# Patient Record
Sex: Male | Born: 1975 | Race: White | Hispanic: No | Marital: Married | Smoking: Current every day smoker
Health system: Southern US, Community
[De-identification: ages and names within clinical notes are randomized; demographics above are authoritative.]

## PROBLEM LIST (undated history)

## (undated) DIAGNOSIS — I10 Essential (primary) hypertension: Secondary | ICD-10-CM

---

## 2016-05-23 ENCOUNTER — Emergency Department
Admission: EM | Admit: 2016-05-23 | Discharge: 2016-05-23 | Disposition: A | Discharge status: Home / Self Care | Payer: Self-pay | Source: Home / Self Care | Attending: Family Medicine | Admitting: Family Medicine

## 2016-05-23 ENCOUNTER — Encounter: Payer: Self-pay | Admitting: *Deleted

## 2016-05-23 ENCOUNTER — Emergency Department (INDEPENDENT_AMBULATORY_CARE_PROVIDER_SITE_OTHER): Payer: Self-pay

## 2016-05-23 DIAGNOSIS — M5442 Lumbago with sciatica, left side: Secondary | ICD-10-CM

## 2016-05-23 DIAGNOSIS — M4807 Spinal stenosis, lumbosacral region: Secondary | ICD-10-CM

## 2016-05-23 DIAGNOSIS — M48061 Spinal stenosis, lumbar region without neurogenic claudication: Secondary | ICD-10-CM

## 2016-05-23 DIAGNOSIS — G8929 Other chronic pain: Secondary | ICD-10-CM

## 2016-05-23 MED ORDER — HYDROCODONE-ACETAMINOPHEN 5-325 MG PO TABS: ORAL_TABLET | ORAL | 0 refills | Status: AC

## 2016-05-23 MED ORDER — PREDNISONE 20 MG PO TABS: ORAL_TABLET | ORAL | 0 refills | Status: AC

## 2016-05-23 NOTE — ED Provider Notes (Signed)
[facility]    CSN: [id number] Arrival date & time: 05/23/16  1738     History   Chief Complaint Chief Complaint  Patient presents with  . Back Pain    HPI Glenn Aguirre is a 41 y.o. male.   Patient complains of 1.5 month history of left lower back/buttock pain that subsequently began to radiate into his left lower leg to the lateral calf.  The pain is worse when walking and somewhat better sitting.   He denies bowel or bladder dysfunction, and no saddle numbness.  He recalls no injury.  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  He has had poor response to ice packs and stretching exercises.   The history is provided by the patient.  Back Pain  Location:  Lumbar spine and gluteal region Quality:  Aching Radiates to: left lower lateral leg. Pain severity:  Mild Pain is:  Worse during the day Onset quality:  Sudden Duration:  6 weeks Timing:  Constant Progression:  Worsening Chronicity:  New Context: lifting heavy objects and recent injury   Relieved by:  Nothing Worsened by:  Ambulation Ineffective treatments:  Cold packs Associated symptoms: no abdominal pain, no bladder incontinence, no bowel incontinence, no dysuria, no fever, no headaches, no leg pain, no numbness, no paresthesias, no pelvic pain, no perianal numbness, no tingling, no weakness and no weight loss     History reviewed. No pertinent past medical history.  There are no active problems to display for this patient.   History reviewed. No pertinent surgical history.     Home Medications    Prior to Admission medications   Medication Sig Start Date End Date Taking? Authorizing Provider  HYDROcodone-acetaminophen (NORCO/VICODIN) 5-325 MG tablet Take one by mouth at bedtime as needed for pain 05/23/16   Lattie Haw, MD  predniSONE (DELTASONE) 20 MG tablet Take one tab by mouth twice daily for 5 days, then one daily. Take with food. 05/23/16   Lattie Haw, MD    Family History History reviewed. No pertinent family history.  Social History Social History  Substance Use Topics  . Smoking status: Never Smoker  . Smokeless tobacco: Never Used  . Alcohol use No     Allergies   Patient has no known allergies.   Review of Systems Review of Systems  Constitutional: Negative for fever and weight loss.  Gastrointestinal: Negative for abdominal pain and bowel incontinence.  Genitourinary: Negative for bladder incontinence, dysuria and pelvic pain.  Musculoskeletal: Positive for back pain.  Neurological: Negative for tingling, weakness, numbness, headaches and paresthesias.  All other systems reviewed and are negative.    Physical Exam Triage Vital Signs ED Triage Vitals  Enc Vitals Group     BP 05/23/16 1752 147/91     Pulse Rate 05/23/16 1752 92     Resp --      Temp --      Temp src --      SpO2 05/23/16 1752 97 %     Weight 05/23/16 1753 209 lb (94.8 kg)     Height 05/23/16 1753 6' (1.829 m)     Head Circumference --      Peak Flow --      Pain Score 05/23/16 1753 9     Pain Loc --      Pain Edu? --      Excl. in GC? --    No data found.   Updated  Vital Signs BP 147/91 (BP Location: Left Arm)   Pulse 92   Ht 6' (1.829 m)   Wt 209 lb (94.8 kg)   SpO2 97%   BMI 28.35 kg/m   Visual Acuity Right Eye Distance:   Left Eye Distance:   Bilateral Distance:    Right Eye Near:   Left Eye Near:    Bilateral Near:     Physical Exam  Constitutional: He appears well-developed and well-nourished. No distress.  HENT:  Head: Normocephalic.  Right Ear: External ear normal.  Left Ear: External ear normal.  Nose: Nose normal.  Mouth/Throat: Oropharynx is clear and moist.  Eyes: Conjunctivae are normal. Pupils are equal, round, and reactive to light.  Neck: Normal range of motion.  Cardiovascular: Normal heart sounds.   Pulmonary/Chest: Breath sounds normal.  Abdominal: There is no tenderness.    Musculoskeletal: He exhibits no edema.       Lumbar back: He exhibits tenderness. He exhibits normal range of motion.       Back:  Back:  Range of motion relatively well preserved.  Can heel/toe walk and squat without difficulty.  There is tenderness to palpation over the left buttock as noted on diagram.   Straight leg raising test is positive at about 30 degrees from horizontal.  Sitting knee extension test is positive at about 10 degrees from horizontal.  Strength and sensation in the lower extremities is normal.  Patellar and achilles reflexes are normal   Neurological: He is alert.  Skin: Skin is warm and dry. No rash noted.  Nursing note and vitals reviewed.    UC Treatments / Results  Labs (all labs ordered are listed, but only abnormal results are displayed) Labs Reviewed - No data to display  EKG  EKG Interpretation None       Radiology Dg Lumbar Spine Complete  Result Date: 05/23/2016 CLINICAL DATA:  Left lower back pain radiating down left posterior leg for 1-1/2 months. EXAM: LUMBAR SPINE - COMPLETE 4+ VIEW COMPARISON:  None. FINDINGS: There is no evidence of acute lumbar spine fracture nor bone destruction. Alignment is normal. Slight disc space narrowing noted at L4-5 and to a greater extent L5-S1. Mild associated facet joint osteoarthritis L3 through S1, most pronounced on the right at L4-5. Sacroiliac joints are maintained. Small Schmorl's nodes involving the superior endplates of L1 and L2. IMPRESSION: Mild disc space narrowing at L4-5 and L5-S1. Mild lower lumbar facet arthropathy. Electronically Signed   By: Tollie Eth M.D.   On: 05/23/2016 18:36    Procedures Procedures (including critical care time)  Medications Ordered in UC Medications - No data to display   Initial Impression / Assessment and Plan / UC Course  I have reviewed the triage vital signs and the nursing notes.  Pertinent labs & imaging results that were available during my care of the  patient were reviewed by me and considered in my medical decision making (see chart for details).  Clinical Course   Appears to be left L5-S1 radiculopathy. Begin prednisone burst/taper. Lortab for pain at night. Apply ice pack for 20 to 30 minutes, 2 to 3 times daily  Continue until pain decreases.  Followup with Dr. Rodney Langton or Dr. Clementeen Graham ([facility]) if not improving in one week.     Final Clinical Impressions(s) / UC Diagnoses   Final diagnoses:  Chronic left-sided low back pain with left-sided sciatica    New Prescriptions New Prescriptions   HYDROCODONE-ACETAMINOPHEN (NORCO/VICODIN) 5-325 MG  TABLET    Take one by mouth at bedtime as needed for pain   PREDNISONE (DELTASONE) 20 MG TABLET    Take one tab by mouth twice daily for 5 days, then one daily. Take with food.     Lattie HawStephen A Yaminah Clayborn, MD 05/23/16 [phone]

## 2016-05-23 NOTE — Discharge Instructions (Signed)
Apply ice pack for 20 to 30 minutes, 2 to 3 times daily  Continue until pain decreases.

## 2016-05-23 NOTE — ED Triage Notes (Signed)
Patient c/o 1 1/2 months of left lower back/glute pain that started one morning and has gradually become worse. Pain radiates down leg and into left foot. Used ice and stretching exercises @ home.

## 2016-12-26 ENCOUNTER — Emergency Department
Admission: EM | Admit: 2016-12-26 | Discharge: 2016-12-26 | Disposition: A | Discharge status: Home / Self Care | Payer: Self-pay | Source: Home / Self Care | Attending: Family Medicine | Admitting: Family Medicine

## 2016-12-26 ENCOUNTER — Emergency Department (INDEPENDENT_AMBULATORY_CARE_PROVIDER_SITE_OTHER): Payer: Self-pay

## 2016-12-26 DIAGNOSIS — S81031A Puncture wound without foreign body, right knee, initial encounter: Secondary | ICD-10-CM

## 2016-12-26 DIAGNOSIS — M25461 Effusion, right knee: Secondary | ICD-10-CM

## 2016-12-26 DIAGNOSIS — Z23 Encounter for immunization: Secondary | ICD-10-CM

## 2016-12-26 DIAGNOSIS — M7989 Other specified soft tissue disorders: Secondary | ICD-10-CM

## 2016-12-26 DIAGNOSIS — M25561 Pain in right knee: Secondary | ICD-10-CM

## 2016-12-26 HISTORY — DX: Essential (primary) hypertension: I10

## 2016-12-26 MED ORDER — TETANUS-DIPHTH-ACELL PERTUSSIS 5-2.5-18.5 LF-MCG/0.5 IM SUSP
0.5000 mL | Freq: Once | INTRAMUSCULAR | Status: AC
  Administered 2016-12-26: 0.5 mL via INTRAMUSCULAR

## 2016-12-26 MED ORDER — CEPHALEXIN 500 MG PO CAPS: 500.0000 mg | ORAL_CAPSULE | Freq: Three times a day (TID) | ORAL | 0 refills | Status: AC

## 2016-12-26 NOTE — Medical Student Note (Cosign Needed)
[facility] Provider Student Note For educational purposes for Medical, PA and NP students only and not part of the legal medical record.   CSN: [id number] Arrival date & time: 12/26/16  1545     History   Chief Complaint Chief Complaint  Patient presents with  . Knee Pain    right    HPI Glenn Aguirre is a 41 y.o. male.  Pt is a 40 yo male who fell on his screwdriver in the back of his truck. Last tetanus shot is unknown.   The history is provided by the patient.  Knee Pain  Location:  Knee Injury: yes   Knee location:  R knee Pain details:    Quality:  Throbbing   Radiates to:  Does not radiate   Severity:  Mild   Timing:  Constant   Progression:  Waxing and waning Chronicity:  New Dislocation: no   Tetanus status:  Unknown Prior injury to area:  No Relieved by:  NSAIDs and ice Worsened by:  Activity Associated symptoms: decreased ROM, stiffness and swelling   Associated symptoms: no fever     Past Medical History:  Diagnosis Date  . Hypertension     There are no active problems to display for this patient.   History reviewed. No pertinent surgical history.     Home Medications    Prior to Admission medications   Medication Sig Start Date End Date Taking? Authorizing Provider  cephALEXin (KEFLEX) 500 MG capsule Take 1 capsule (500 mg total) by mouth 3 (three) times daily. 12/26/16   Lurene Shadow, PA-C  HYDROcodone-acetaminophen (NORCO/VICODIN) 5-325 MG tablet Take one by mouth at bedtime as needed for pain 05/23/16   Lattie Haw, MD  predniSONE (DELTASONE) 20 MG tablet Take one tab by mouth twice daily for 5 days, then one daily. Take with food. 05/23/16   Lattie Haw, MD    Family History Family History  Problem Relation Age of Onset  . Heart failure Mother   . Heart failure Father     Social History Social History  Substance Use Topics  . Smoking status: Current Every Day Smoker    Packs/day: 1.00  .  Smokeless tobacco: Never Used  . Alcohol use No     Allergies   Patient has no known allergies.   Review of Systems Review of Systems  Constitutional: Negative for fever.  Musculoskeletal: Positive for joint swelling and stiffness.     Physical Exam Updated Vital Signs BP (!) 143/98 (BP Location: Left Arm)   Pulse 99   Temp 98.3 F (36.8 C) (Oral)   Ht 6' (1.829 m)   Wt 97.1 kg   SpO2 99%   BMI 29.02 kg/m   Physical Exam  Constitutional: He appears well-developed and well-nourished.  Musculoskeletal: He exhibits edema and tenderness.  At right knee  Skin: There is erythema.  Mild erythema on the lateral aspect of right knee      ED Treatments / Results    Radiology Dg Knee Complete 4 Views Right  Result Date: 12/26/2016 CLINICAL DATA:  Pain, puncture wound with a screwdriver 10 days ago EXAM: RIGHT KNEE - COMPLETE 4+ VIEW COMPARISON:  None. FINDINGS: No fracture or dislocation is seen. The joint spaces are preserved. Infrapatellar soft tissue swelling. No radiopaque foreign body is seen. IMPRESSION: Infrapatellar soft tissue swelling. No fracture, dislocation, or radiopaque foreign body is seen. Electronically Signed   By: Charline Bills M.D.   On:  12/26/2016 16:31    Procedures Procedures (including critical care time)  Medications Ordered in ED Medications  Tdap (BOOSTRIX) injection 0.5 mL (0.5 mLs Intramuscular Given 12/26/16 1611)     Initial Impression / Assessment and Plan / ED Course  I have reviewed the triage vital signs and the nursing notes.  Pertinent labs & imaging results that were available during my care of the patient were reviewed by me and considered in my medical decision making (see chart for details).    Imaging revealed soft tissue swelling without foreign body. Starting Keflex 500 mg, 1 capsule 3 times per day. Follow up with Sports Medicine later this week.  Final Clinical Impressions(s) / ED Diagnoses   Final diagnoses:    Pain and swelling of right knee  Puncture wound of right knee, initial encounter    New Prescriptions New Prescriptions   CEPHALEXIN (KEFLEX) 500 MG CAPSULE    Take 1 capsule (500 mg total) by mouth 3 (three) times daily.

## 2016-12-26 NOTE — ED Triage Notes (Signed)
Pt had screw driver in the door of truck, and as he was getting out of the truck last Friday, 8/10, it punctured his right knee.  The last few days, has had swelling and pain. Denies drainage.  Unsure of last tetanus.

## 2016-12-26 NOTE — ED Provider Notes (Signed)
[name]    CSN: [id number] Arrival date & time: 12/26/16  1545     History   Chief Complaint Chief Complaint  Patient presents with  . Knee Pain    right    HPI Glenn Aguirre is a 41 y.o. male.   HPI  Glenn Aguirre is a 42 y.o. male presenting to UC with c/o intermittent Right knee pain and swelling that started about 1 week ago after puncturing his knee on a screwdriver after getting out of his truck on Friday 12/16/16.  Pain is aching and sore, 2/10 at this time.  Pt notes his knee swells more by the end of the day as he works on his knees as a Nutritional therapist.  Pain and swelling improves with rest, ice, and elevation. He has also taken Advil. No prior surgery or serious injury to Right knee. Last tetanus- unknown.    Past Medical History:  Diagnosis Date  . Hypertension     There are no active problems to display for this patient.   History reviewed. No pertinent surgical history.     Home Medications    Prior to Admission medications   Medication Sig Start Date End Date Taking? Authorizing Provider  cephALEXin (KEFLEX) 500 MG capsule Take 1 capsule (500 mg total) by mouth 3 (three) times daily. 12/26/16   Lurene Shadow, PA-C  HYDROcodone-acetaminophen (NORCO/VICODIN) 5-325 MG tablet Take one by mouth at bedtime as needed for pain 05/23/16   Lattie Haw, MD  predniSONE (DELTASONE) 20 MG tablet Take one tab by mouth twice daily for 5 days, then one daily. Take with food. 05/23/16   Lattie Haw, MD    Family History Family History  Problem Relation Age of Onset  . Heart failure Mother   . Heart failure Father     Social History Social History  Substance Use Topics  . Smoking status: Current Every Day Smoker    Packs/day: 1.00  . Smokeless tobacco: Never Used  . Alcohol use No     Allergies   Patient has no known allergies.   Review of Systems Review of Systems  Constitutional: Negative for chills and fever.  Musculoskeletal:  Positive for arthralgias, joint swelling and myalgias.  Skin: Positive for wound. Negative for color change.     Physical Exam Triage Vital Signs ED Triage Vitals [12/26/16 1604]  Enc Vitals Group     BP (!) 143/98     Pulse Rate 99     Resp      Temp 98.3 F (36.8 C)     Temp Source Oral     SpO2 99 %     Weight 214 lb (97.1 kg)     Height 6' (1.829 m)     Head Circumference      Peak Flow      Pain Score 2     Pain Loc      Pain Edu?      Excl. in GC?    No data found.   Updated Vital Signs BP (!) 143/98 (BP Location: Left Arm)   Pulse 99   Temp 98.3 F (36.8 C) (Oral)   Ht 6' (1.829 m)   Wt 214 lb (97.1 kg)   SpO2 99%   BMI 29.02 kg/m      Physical Exam  Constitutional: He is oriented to person, place, and time. He appears well-developed and well-nourished. No distress.  HENT:  Head: Normocephalic and atraumatic.  Eyes:  EOM are normal.  Neck: Normal range of motion.  Cardiovascular: Normal rate.   Pulmonary/Chest: Effort normal.  Musculoskeletal: Normal range of motion. He exhibits edema and tenderness.  Right knee: mild edema over tibial tuberosity with mild tenderness. Full ROM. Calf is soft, non-tender.  Neurological: He is alert and oriented to person, place, and time.  Skin: Skin is warm and dry. He is not diaphoretic. There is erythema.  Right knee, anterior aspect: 1cm well healing abrasion over tibial tuberosity. Faint erythema just lateral to abrasion and knee swelling.   Psychiatric: He has a normal mood and affect. His behavior is normal.  Nursing note and vitals reviewed.    UC Treatments / Results  Labs (all labs ordered are listed, but only abnormal results are displayed) Labs Reviewed - No data to display  EKG  EKG Interpretation None       Radiology Dg Knee Complete 4 Views Right  Result Date: 12/26/2016 CLINICAL DATA:  Pain, puncture wound with a screwdriver 10 days ago EXAM: RIGHT KNEE - COMPLETE 4+ VIEW COMPARISON:  None.  FINDINGS: No fracture or dislocation is seen. The joint spaces are preserved. Infrapatellar soft tissue swelling. No radiopaque foreign body is seen. IMPRESSION: Infrapatellar soft tissue swelling. No fracture, dislocation, or radiopaque foreign body is seen. Electronically Signed   By: Charline Bills M.D.   On: 12/26/2016 16:31    Procedures Procedures (including critical care time)  Medications Ordered in UC Medications  Tdap (BOOSTRIX) injection 0.5 mL (0.5 mLs Intramuscular Given 12/26/16 1611)     Initial Impression / Assessment and Plan / UC Course  I have reviewed the triage vital signs and the nursing notes.  Pertinent labs & imaging results that were available during my care of the patient were reviewed by me and considered in my medical decision making (see chart for details).     Plain films: unremarkable.  Questionable early cellulitis w/o abscess.   Final Clinical Impressions(s) / UC Diagnoses   Final diagnoses:  Pain and swelling of right knee  Puncture wound of right knee, initial encounter   Tdap updated in UC Will start on Keflex Knee sleeve applied for comfort. Encouraged rest, ice, compression, and elevation. May continue Advil. F/u with Sports Medicine later this week if not improving, sooner if worsening.   New Prescriptions Discharge Medication List as of 12/26/2016  4:46 PM    START taking these medications   Details  cephALEXin (KEFLEX) 500 MG capsule Take 1 capsule (500 mg total) by mouth 3 (three) times daily., Starting Mon 12/26/2016, Normal         Controlled Substance Prescriptions [place] Controlled Substance Registry consulted? Not Applicable   Rolla Plate 12/26/16 1721

## 2018-06-15 IMAGING — DX DG LUMBAR SPINE COMPLETE 4+V
5 series · 5 of 5 positions shown · non-contrast
Comparison: None.

CLINICAL DATA: Left lower back pain radiating down left posterior
leg for 1-1/2 months.

EXAM:
LUMBAR SPINE - COMPLETE 4+ VIEW

[l-spine ap]
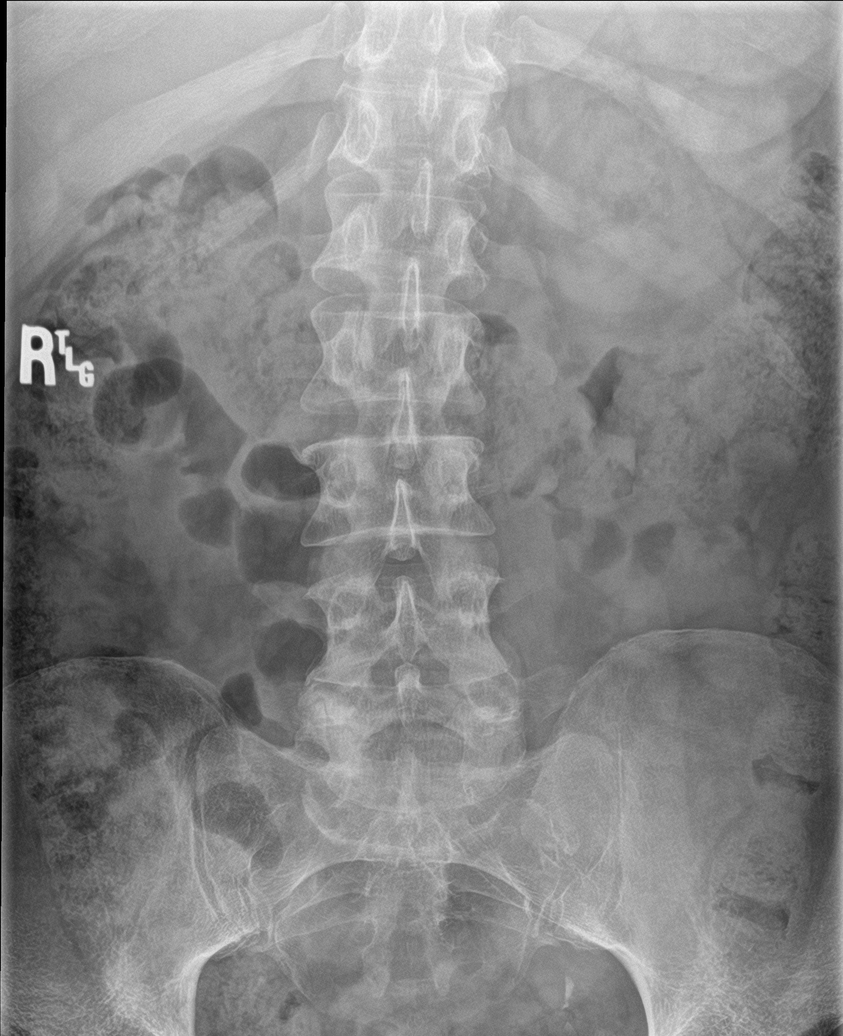

[l-spine obl (1 of 2)]
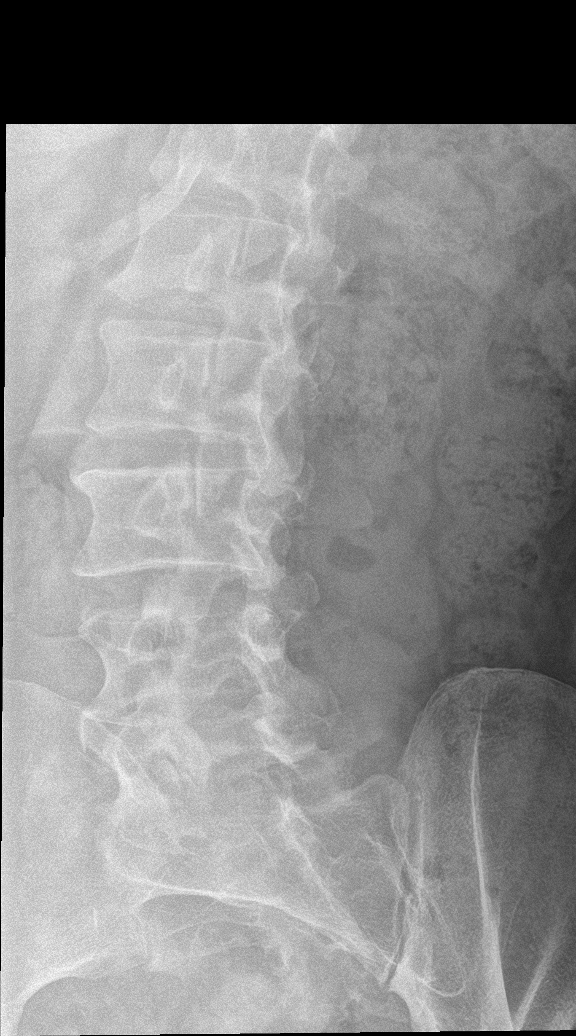

[l-spine obl (2 of 2)]
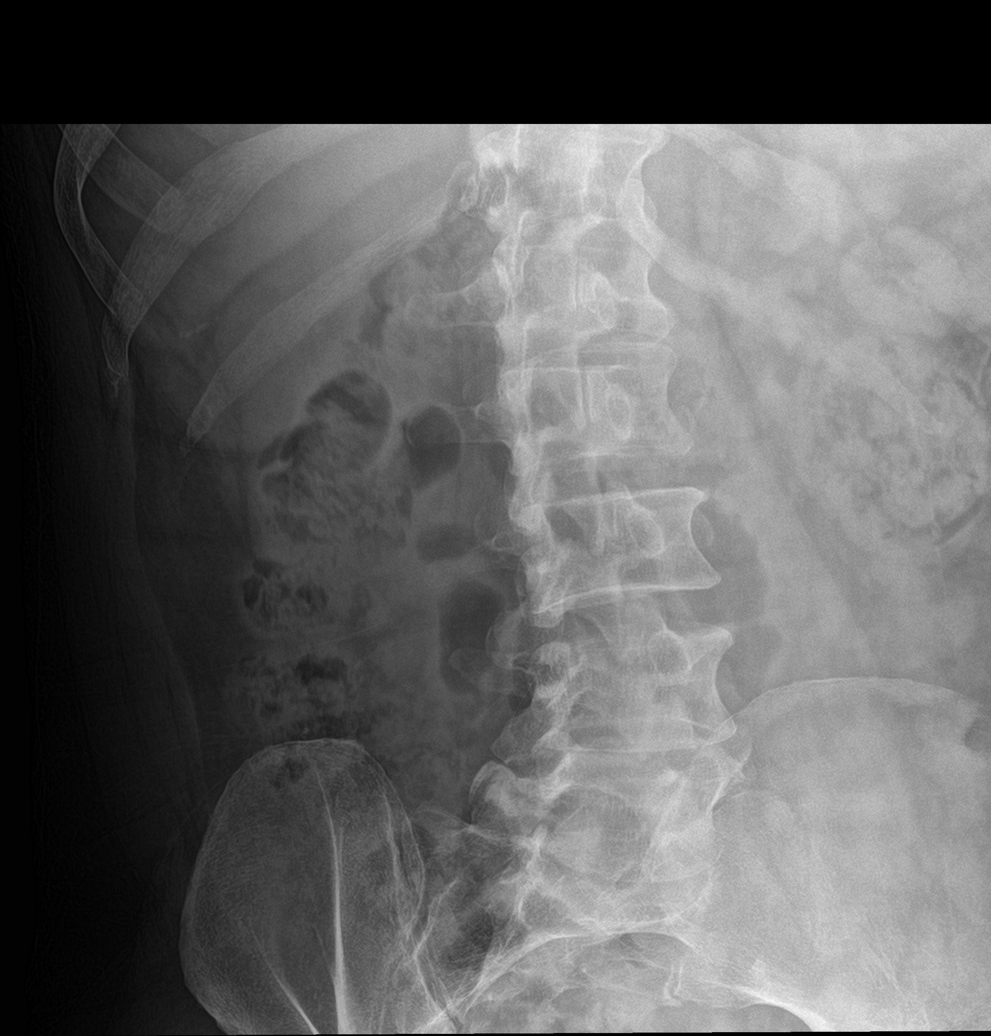

[l-spine lat]
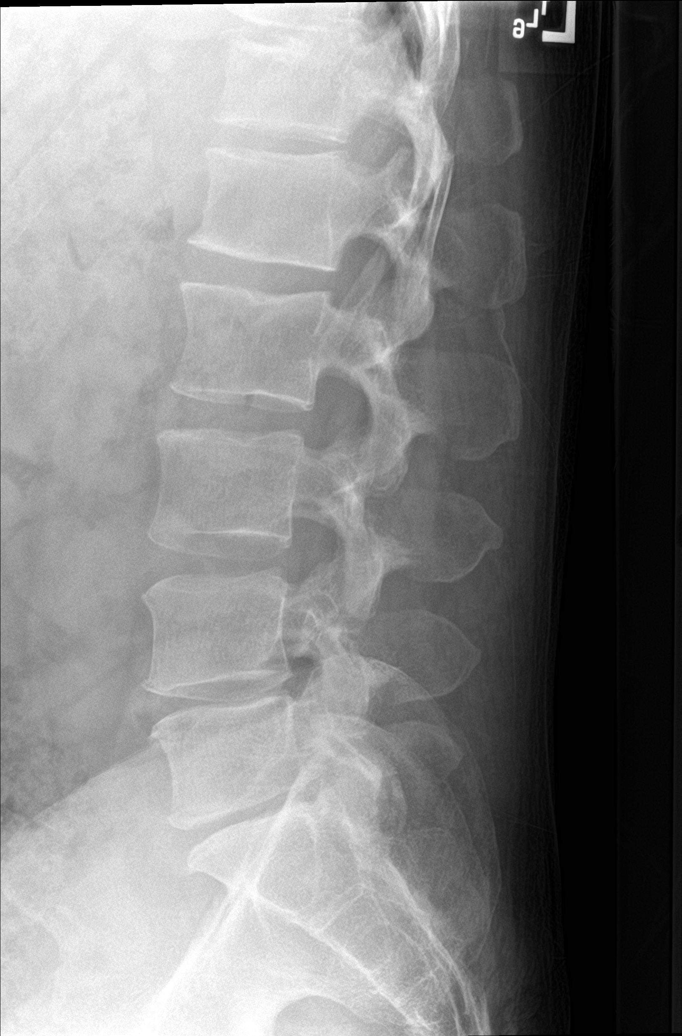

[l-spine spot]
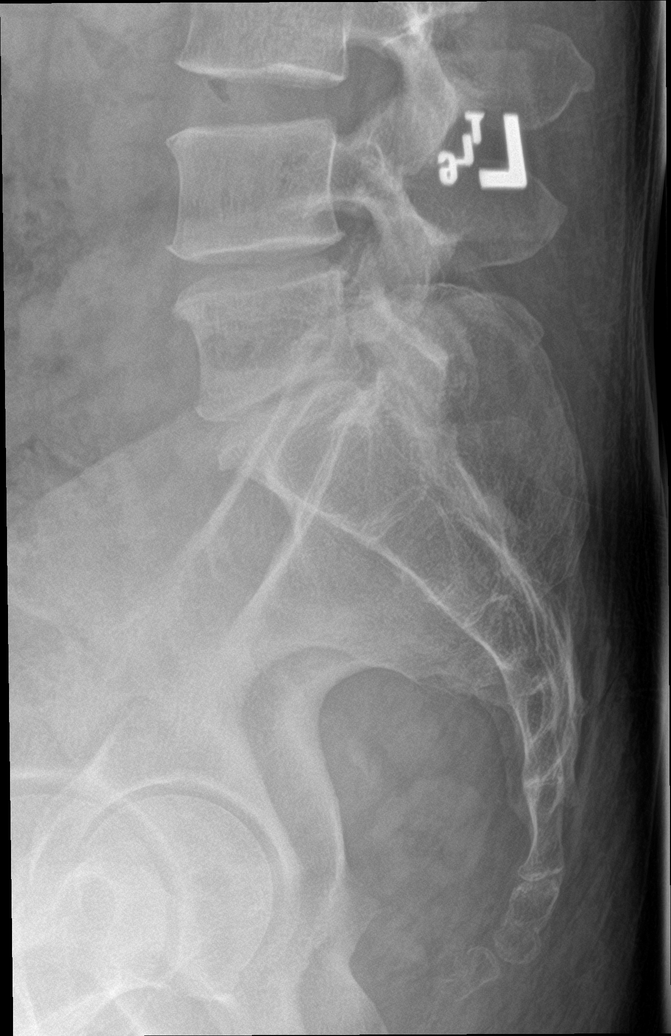

[5 of 5 positions shown; findings below may reference images not displayed]

FINDINGS: There is no evidence of acute lumbar spine fracture nor bone
destruction. Alignment is normal. Slight disc space narrowing noted
at L4-5 and to a greater extent L5-S1. Mild associated facet joint
osteoarthritis L3 through S1, most pronounced on the right at L4-5.
Sacroiliac joints are maintained. Small Schmorl's nodes involving
the superior endplates of L1 and L2.
IMPRESSION: Mild disc space narrowing at L4-5 and L5-S1. Mild lower lumbar facet
arthropathy.

## 2020-07-07 DEATH — deceased
